# Patient Record
Sex: Female | Born: 1945 | Race: White | Hispanic: No | Marital: Married | Smoking: Never smoker
Health system: Southern US, Community
[De-identification: ages and names within clinical notes are randomized; demographics above are authoritative.]

---

## 2017-06-09 ENCOUNTER — Emergency Department (HOSPITAL_COMMUNITY): Payer: Medicare (Managed Care)

## 2017-06-09 ENCOUNTER — Encounter (HOSPITAL_COMMUNITY): Payer: Self-pay | Admitting: Emergency Medicine

## 2017-06-09 ENCOUNTER — Emergency Department (HOSPITAL_COMMUNITY)
Admission: EM | Admit: 2017-06-09 | Discharge: 2017-06-09 | Disposition: A | Discharge status: Home / Self Care | Payer: Medicare (Managed Care) | Attending: Emergency Medicine | Admitting: Emergency Medicine

## 2017-06-09 DIAGNOSIS — Y999 Unspecified external cause status: Secondary | ICD-10-CM | POA: Insufficient documentation

## 2017-06-09 DIAGNOSIS — Y9389 Activity, other specified: Secondary | ICD-10-CM | POA: Insufficient documentation

## 2017-06-09 DIAGNOSIS — W230XXA Caught, crushed, jammed, or pinched between moving objects, initial encounter: Secondary | ICD-10-CM | POA: Insufficient documentation

## 2017-06-09 DIAGNOSIS — Y929 Unspecified place or not applicable: Secondary | ICD-10-CM | POA: Diagnosis not present

## 2017-06-09 DIAGNOSIS — S62660B Nondisplaced fracture of distal phalanx of right index finger, initial encounter for open fracture: Secondary | ICD-10-CM | POA: Diagnosis not present

## 2017-06-09 DIAGNOSIS — S6991XA Unspecified injury of right wrist, hand and finger(s), initial encounter: Secondary | ICD-10-CM | POA: Diagnosis present

## 2017-06-09 MED ORDER — CEPHALEXIN 500 MG PO CAPS: 500.0000 mg | ORAL_CAPSULE | Freq: Four times a day (QID) | ORAL | 0 refills | Status: AC

## 2017-06-09 MED ORDER — HYDROCODONE-ACETAMINOPHEN 5-325 MG PO TABS: 1.0000 | ORAL_TABLET | ORAL | 0 refills | Status: AC | PRN

## 2017-06-09 MED ORDER — BACITRACIN ZINC 500 UNIT/GM EX OINT: TOPICAL_OINTMENT | Freq: Two times a day (BID) | CUTANEOUS | Status: DC

## 2017-06-09 MED ORDER — IBUPROFEN 200 MG PO TABS
600.0000 mg | ORAL_TABLET | Freq: Once | ORAL | Status: AC
  Administered 2017-06-09: 600 mg via ORAL
  Filled 2017-06-09: qty 3

## 2017-06-09 NOTE — ED Provider Notes (Signed)
[facility]-EMERGENCY DEPT Provider Note   CSN: [id number] Arrival date & time: 06/09/17  1558     History   Chief Complaint Chief Complaint  Patient presents with  . Finger Injury    HPI Beverly Thompson is a 71 y.o. female who presents to the ED with right index finger pain s/p closing her finger in a car door. Patient is from [place] and here in [place] visiting her son. Patient c/o pain, swelling and laceration to the right index finger.   HPI  History reviewed. No pertinent past medical history.  There are no active problems to display for this patient.   History reviewed. No pertinent surgical history.  OB History    No data available       Home Medications    Prior to Admission medications   Medication Sig Start Date End Date Taking? Authorizing Provider  cephALEXin (KEFLEX) 500 MG capsule Take 1 capsule (500 mg total) by mouth 4 (four) times daily. 06/09/17   Janne NapoleonNeese, Hope M, NP  HYDROcodone-acetaminophen (NORCO/VICODIN) 5-325 MG tablet Take 1 tablet by mouth every 4 (four) hours as needed. 06/09/17   Janne NapoleonNeese, Hope M, NP    Family History No family history on file.  Social History Social History   Tobacco Use  . Smoking status: Never Smoker  . Smokeless tobacco: Never Used  Substance Use Topics  . Alcohol use: No    Frequency: Never  . Drug use: Not on file     Allergies   Patient has no allergy information on record.   Review of Systems Review of Systems  Musculoskeletal: Positive for arthralgias.  Skin: Positive for wound.  All other systems reviewed and are negative.    Physical Exam Updated Vital Signs BP (!) 150/77 (BP Location: Left Arm)   Pulse 65   Temp 98 F (36.7 C) (Oral)   Resp 18   Ht 5\' 2"  (1.575 m)   Wt 61.7 kg (136 lb)   SpO2 100%   BMI 24.87 kg/m   Physical Exam  Constitutional: She appears well-developed and well-nourished. No distress.  Eyes: EOM are normal.  Neck: Neck supple.    Cardiovascular: Normal rate.  Pulmonary/Chest: Effort normal.  Musculoskeletal: Normal range of motion.       Right hand: Normal sensation noted. Normal strength noted. She exhibits no thumb/finger opposition.       Hands: Laceration through the nail of the right index finger, small amount of bleeding.  Neurological: She is alert.  Skin: Skin is warm and dry.  Psychiatric: She has a normal mood and affect. Her behavior is normal.  Nursing note and vitals reviewed.    ED Treatments / Results  Labs (all labs ordered are listed, but only abnormal results are displayed) Labs Reviewed - No data to display  Radiology Dg Finger Index Right  Result Date: 06/09/2017 CLINICAL DATA:  71 year old female with pain at distal right index finger status post trauma. EXAM: RIGHT INDEX FINGER 2+V COMPARISON:  None. FINDINGS: There is a nondisplaced transverse fracture at the distal aspect of the distal phalanx. Note is made of overlying bandage material. Remaining osseous and soft tissue structures unremarkable. IMPRESSION: Nondisplaced fracture at the distal tuft of the second distal phalanx. Electronically Signed   By: Sande BrothersSerena  Chacko M.D.   On: 06/09/2017 17:03    Procedures Procedures (including critical care time) Wound soaked in NSS, cleaned with betadine scrub brush and then irrigated with NSS. dermabond applied to the nail. Dressing  and splint.  Medications Ordered in ED Medications  ibuprofen (ADVIL,MOTRIN) tablet 600 mg (600 mg Oral Given 06/09/17 1913)     Initial Impression / Assessment and Plan / ED Course  I have reviewed the triage vital signs and the nursing notes. Dr. Patria Maneampos in to see the patient prior to splint application and discuss plan of care. Return precautions discussed. Patient stable for d/c without focal neuro deficits. Will treat for pain and give short course of antibiotics.   Final Clinical Impressions(s) / ED Diagnoses   Final diagnoses:  Open nondisplaced  fracture of distal phalanx of right index finger, initial encounter    ED Discharge Orders        Ordered    cephALEXin (KEFLEX) 500 MG capsule  4 times daily     06/09/17 1901    HYDROcodone-acetaminophen (NORCO/VICODIN) 5-325 MG tablet  Every 4 hours PRN     06/09/17 1901       Janne Napoleoneese, Hope M, NP 06/09/17 Barnie [id number]    Azalia Bilisampos, Kevin, MD 06/09/17 2330

## 2017-06-09 NOTE — Discharge Instructions (Signed)
Follow up with your doctor. Return for worsening symptoms.  Do not drive while taking the narcotic as it will make you sleepy.  Take ibuprofen in addition to the medications we give you.

## 2017-06-09 NOTE — ED Triage Notes (Addendum)
Patient here with complaints of finger pain. Reports shutting right pointer finger in car door. Bleeding controlled.

## 2019-07-23 IMAGING — CR DG FINGER INDEX 2+V*R*
3 series · 3 of 3 positions shown · non-contrast
Comparison: None.

CLINICAL DATA: 71-year-old female with pain at distal right index
finger status post trauma.

EXAM:
RIGHT INDEX FINGER 2+V

[x finger pa right]
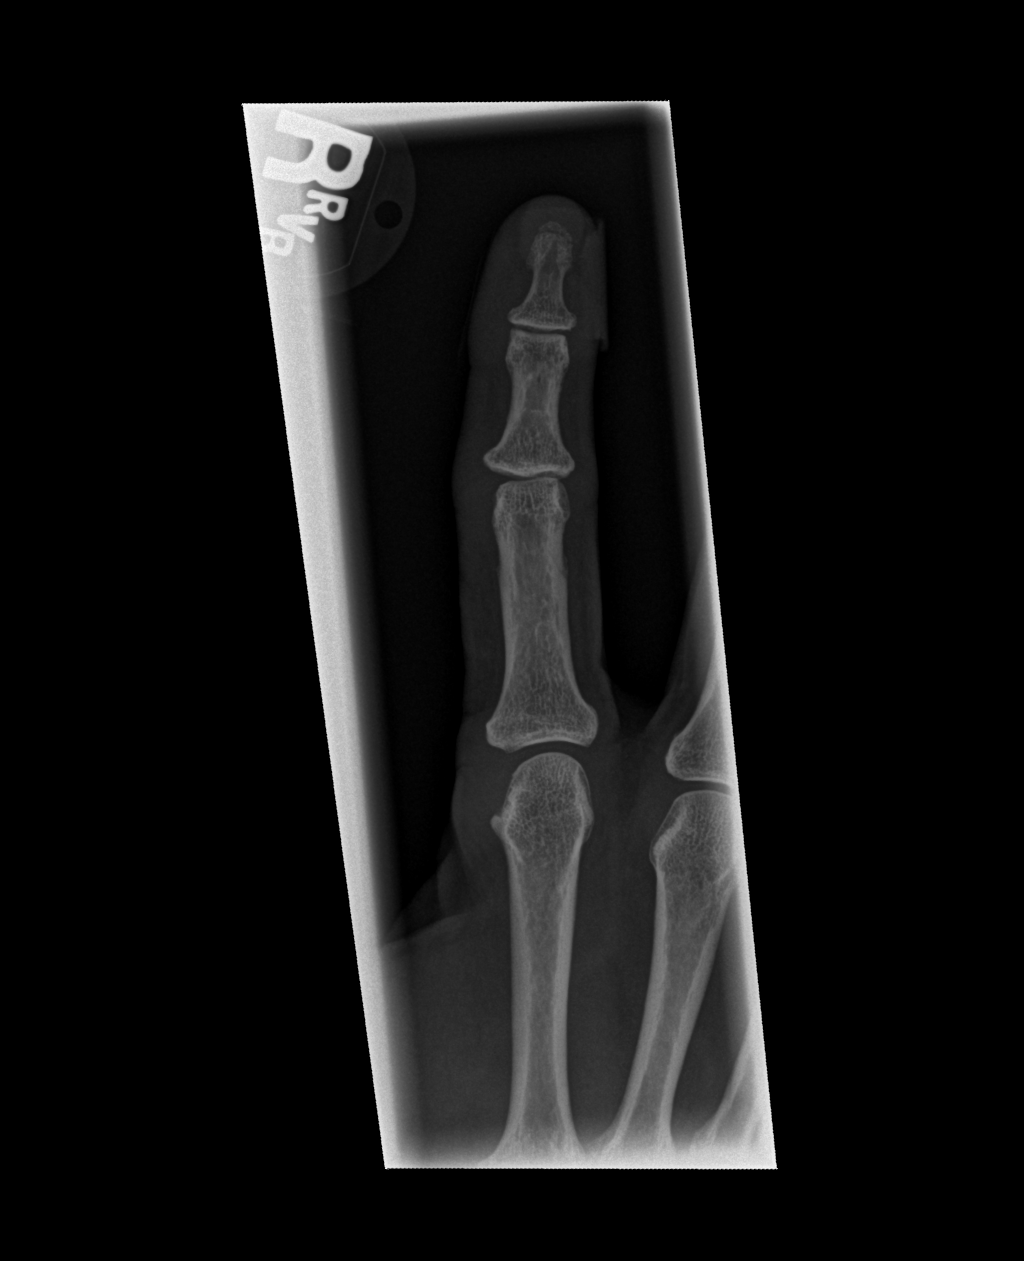

[x finger obl right]
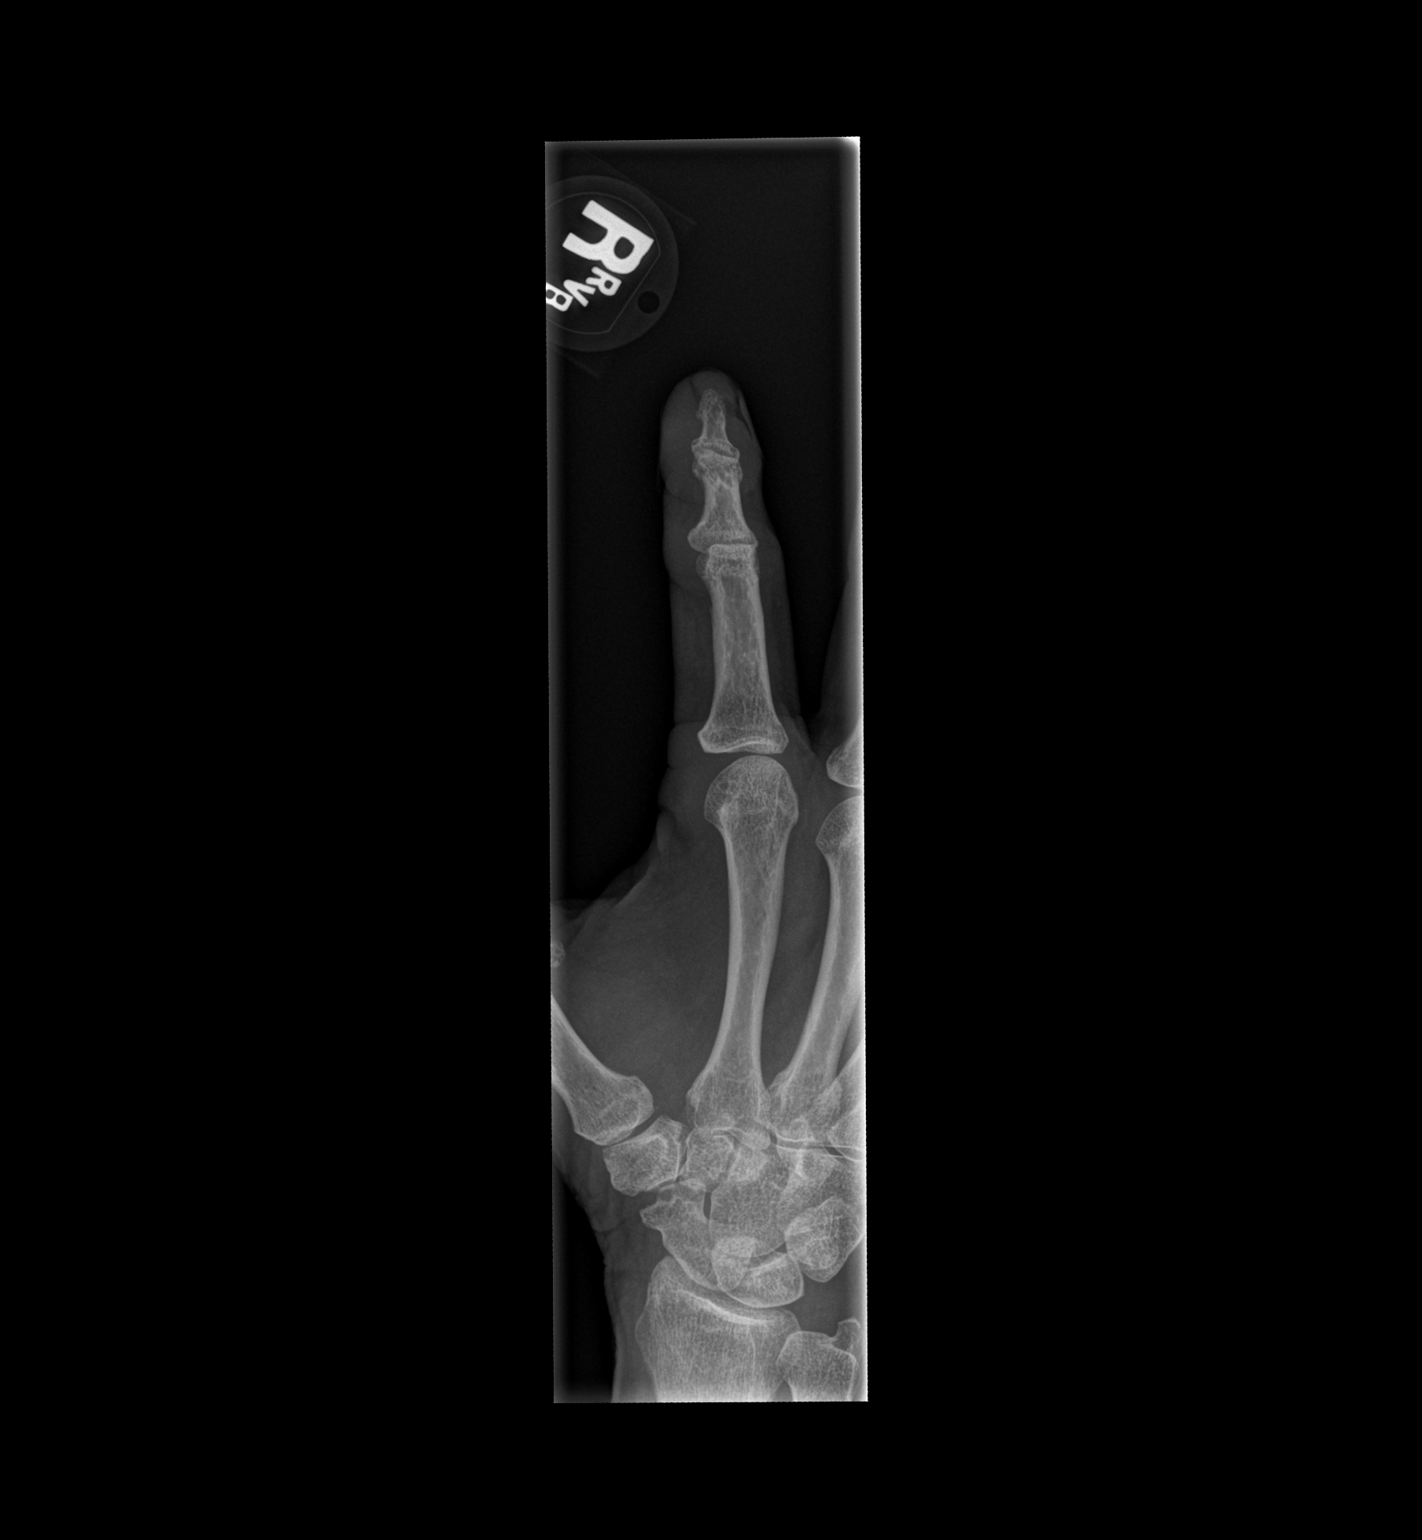

[x finger lat right]
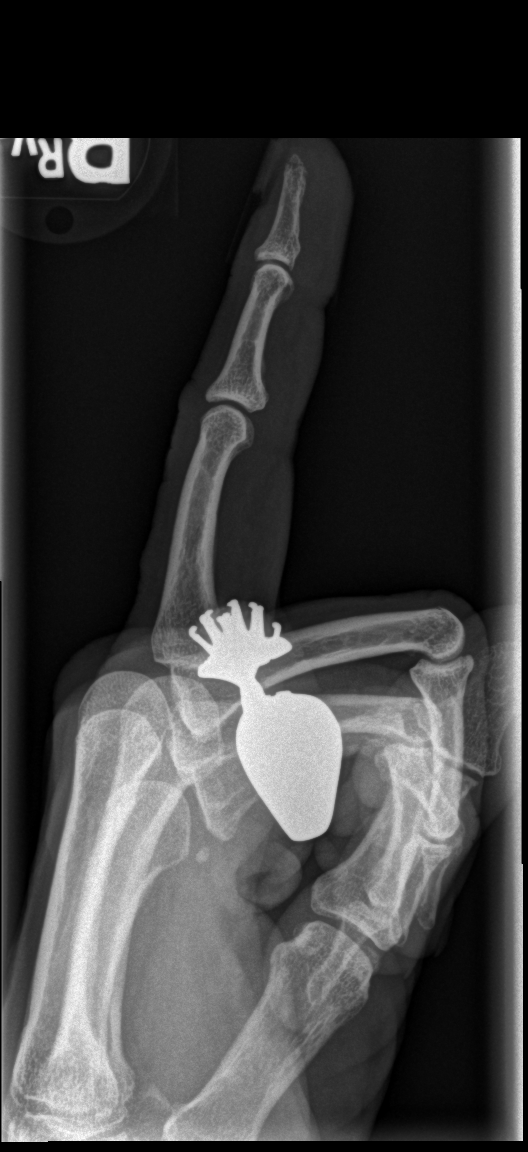

[3 of 3 positions shown; findings below may reference images not displayed]

FINDINGS: There is a nondisplaced transverse fracture at the distal aspect of
the distal phalanx. Note is made of overlying bandage material.
Remaining osseous and soft tissue structures unremarkable.
IMPRESSION: Nondisplaced fracture at the distal tuft of the second distal
phalanx.
